# Patient Record
Sex: Female | Born: 1995 | Race: White | Hispanic: No | Marital: Single | Smoking: Never smoker
Health system: Southern US, Community
[De-identification: ages and names within clinical notes are randomized; demographics above are authoritative.]

---
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
## 2019-01-07 ENCOUNTER — Other Ambulatory Visit: Payer: Self-pay

## 2019-01-07 DIAGNOSIS — Z20822 Contact with and (suspected) exposure to covid-19: Secondary | ICD-10-CM

## 2019-01-08 LAB — NOVEL CORONAVIRUS, NAA: SARS-CoV-2, NAA: NOT DETECTED

## 2019-02-04 ENCOUNTER — Other Ambulatory Visit: Payer: Self-pay

## 2019-02-04 DIAGNOSIS — Z20822 Contact with and (suspected) exposure to covid-19: Secondary | ICD-10-CM

## 2019-02-06 LAB — NOVEL CORONAVIRUS, NAA: SARS-CoV-2, NAA: NOT DETECTED

## 2020-09-11 ENCOUNTER — Ambulatory Visit (LOCAL_COMMUNITY_HEALTH_CENTER): Payer: Self-pay | Admitting: Nurse Practitioner

## 2020-09-11 ENCOUNTER — Other Ambulatory Visit: Payer: Self-pay

## 2020-09-11 DIAGNOSIS — Z23 Encounter for immunization: Secondary | ICD-10-CM

## 2020-09-11 NOTE — Progress Notes (Signed)
25 year old female seen today for immunizations.   Glenna Fellows, RN

## 2020-10-02 ENCOUNTER — Ambulatory Visit (LOCAL_COMMUNITY_HEALTH_CENTER): Payer: Self-pay

## 2020-10-02 ENCOUNTER — Other Ambulatory Visit: Payer: Self-pay

## 2020-10-02 DIAGNOSIS — Z0184 Encounter for antibody response examination: Secondary | ICD-10-CM

## 2020-10-02 DIAGNOSIS — Z111 Encounter for screening for respiratory tuberculosis: Secondary | ICD-10-CM

## 2020-10-02 NOTE — Progress Notes (Signed)
Presents for Hepatitis B titer as required for medical stenography clinicals per client. [facility] reflects prior documentation of complete Hepatitis B series. Client elected to have quantitative titer today and to check with finance prior to leaving appt to determine if charged correctly. ROI for titer results signed. Jossie Ng, RN

## 2020-10-03 LAB — HEPATITIS B SURFACE ANTIBODY, QUANTITATIVE: Hepatitis B Surf Ab Quant: 29.6 m[IU]/mL (ref >9.9)

## 2020-10-05 ENCOUNTER — Ambulatory Visit (LOCAL_COMMUNITY_HEALTH_CENTER): Payer: Medicaid Other

## 2020-10-05 DIAGNOSIS — Z0184 Encounter for antibody response examination: Secondary | ICD-10-CM

## 2020-10-05 DIAGNOSIS — Z111 Encounter for screening for respiratory tuberculosis: Secondary | ICD-10-CM

## 2020-10-05 LAB — TB SKIN TEST
Induration: 0 mm
TB Skin Test: NEGATIVE

## 2020-10-05 NOTE — Progress Notes (Signed)
PPDR 0 mm, Negative.  Copy of Hep B titer results showing consistent with immunity given to pt and explained. No additional tx/vaccine indicated. Pt plans to schedule ppd 2nd step in one week. Jerel Shepherd, RN

## 2020-10-13 ENCOUNTER — Ambulatory Visit (LOCAL_COMMUNITY_HEALTH_CENTER): Payer: Self-pay

## 2020-10-13 ENCOUNTER — Other Ambulatory Visit: Payer: Self-pay

## 2020-10-13 DIAGNOSIS — Z111 Encounter for screening for respiratory tuberculosis: Secondary | ICD-10-CM

## 2020-10-16 ENCOUNTER — Other Ambulatory Visit: Payer: Self-pay

## 2020-10-16 ENCOUNTER — Ambulatory Visit (LOCAL_COMMUNITY_HEALTH_CENTER): Payer: Medicaid Other

## 2020-10-16 DIAGNOSIS — Z111 Encounter for screening for respiratory tuberculosis: Secondary | ICD-10-CM

## 2020-10-16 LAB — TB SKIN TEST
Induration: 0 mm
TB Skin Test: NEGATIVE

## 2021-02-19 ENCOUNTER — Ambulatory Visit (LOCAL_COMMUNITY_HEALTH_CENTER): Payer: Self-pay

## 2021-02-19 ENCOUNTER — Other Ambulatory Visit: Payer: Self-pay

## 2021-02-19 DIAGNOSIS — Z23 Encounter for immunization: Secondary | ICD-10-CM

## 2021-02-19 NOTE — Progress Notes (Signed)
In Nurse Clinic for flu vaccine. Meets criteria for state supplied vaccine d/t family planning waiver. Tolerated flu vaccine well. Updated [facility] copy given. Jerel Shepherd, RN

## 2021-09-28 ENCOUNTER — Other Ambulatory Visit: Payer: Self-pay

## 2021-09-28 ENCOUNTER — Emergency Department: Payer: Self-pay

## 2021-09-28 ENCOUNTER — Emergency Department
Admission: EM | Admit: 2021-09-28 | Discharge: 2021-09-28 | Disposition: A | Discharge status: Home / Self Care | Payer: Self-pay | Attending: Emergency Medicine | Admitting: Emergency Medicine

## 2021-09-28 DIAGNOSIS — Y93K1 Activity, walking an animal: Secondary | ICD-10-CM | POA: Insufficient documentation

## 2021-09-28 DIAGNOSIS — M79642 Pain in left hand: Secondary | ICD-10-CM | POA: Insufficient documentation

## 2021-09-28 DIAGNOSIS — W548XXA Other contact with dog, initial encounter: Secondary | ICD-10-CM | POA: Insufficient documentation

## 2021-09-28 DIAGNOSIS — S62637A Displaced fracture of distal phalanx of left little finger, initial encounter for closed fracture: Secondary | ICD-10-CM | POA: Insufficient documentation

## 2021-09-28 DIAGNOSIS — S62639A Displaced fracture of distal phalanx of unspecified finger, initial encounter for closed fracture: Secondary | ICD-10-CM

## 2021-09-28 NOTE — ED Triage Notes (Signed)
Pt comes wit hc/o left pinkie finger injury. Pt states she did this yesterday and may have gotten it tangled in a dog leash. ?

## 2021-09-28 NOTE — ED Provider Notes (Signed)
?[facility] ?Provider Note ? ? ?CSN: [id number] ?Arrival date & time: 09/28/21  1650 ? ?  ? ?History ? ?Chief Complaint  ?Patient presents with  ? Finger Injury  ? ? ?Allison Noble is a 26 y.o. female presents to the emergency department for evaluation of left fifth digit injury.  Yesterday he was walking the dog, leash twisted her left fifth digit, she has had pain swelling and ecchymosis since.  No numbness or tingling.  No bleeding. ? ?HPI ? ?  ? ?Home Medications ?Prior to Admission medications   ?Not on File  ?   ? ?Allergies    ?Patient has no known allergies.   ? ?Review of Systems   ?Review of Systems ? ?Physical Exam ?Updated Vital Signs ?BP (!) 139/101   Pulse 64   Temp 98.3 ?F (36.8 ?C) (Oral)   Resp 18   SpO2 95%  ?Physical Exam ?Constitutional:   ?   Appearance: She is well-developed.  ?HENT:  ?   Head: Normocephalic and atraumatic.  ?Eyes:  ?   Conjunctiva/sclera: Conjunctivae normal.  ?Cardiovascular:  ?   Rate and Rhythm: Normal rate.  ?Pulmonary:  ?   Effort: Pulmonary effort is normal. No respiratory distress.  ?Musculoskeletal:     ?   General: Normal range of motion.  ?   Cervical back: Normal range of motion.  ?   Comments: Left fifth digit with ecchymosis along the DIP joint with normal active extension, limited flexion.  No skin breakdown noted.  Sensation is intact distally.  Nail is intact, no subungual hematoma.  ?Skin: ?   General: Skin is warm.  ?   Findings: No rash.  ?Neurological:  ?   Mental Status: She is alert and oriented to person, place, and time.  ?Psychiatric:     ?   Behavior: Behavior normal.     ?   Thought Content: Thought content normal.  ? ? ?ED Results / Procedures / Treatments   ?Labs ?(all labs ordered are listed, but only abnormal results are displayed) ?Labs Reviewed - No data to display ? ?EKG ?None ? ?Radiology ?DG Finger Little Left ? ?Result Date: 09/28/2021 ?CLINICAL DATA:  Left fifth digit injury EXAM: LEFT LITTLE  FINGER 2+V COMPARISON:  None Available. FINDINGS: Frontal, oblique, and lateral views of the left fifth digit are obtained. There is an avulsion fracture volar aspect base of the fifth distal phalanx, with approximately 8 mm of retraction of the fracture fragment proximally. No other acute bony abnormalities. Joint spaces are well preserved. Diffuse soft tissue swelling. IMPRESSION: 1. Avulsion fracture volar aspect base of the fifth distal phalanx. Electronically Signed   By: Randa Ngo M.D.   On: 09/28/2021 17:21   ? ?Procedures ?Procedures  ?Patient placed into aluminum foam splint with buddy tape. ?Splint placed along the dorsum of the digit to avoid compression along the avulsion fragment ?Medications Ordered in ED ?Medications - No data to display ? ?ED Course/ Medical Decision Making/ A&P ?  ?                        ?Medical Decision Making ?Amount and/or Complexity of Data Reviewed ?Radiology: ordered. ? ? ?26 year old with left fifth digit injury, x-rays show avulsion along the intra-articular portion of the volar aspect of distal phalanx with displacement.  Patient will follow-up with orthopedic hand specialist, she will call office tomorrow to schedule follow-up appointment.  In the meantime dorsal  splint is applied for comfort.  She will take Tylenol and ibuprofen as needed for pain. ?Final Clinical Impression(s) / ED Diagnoses ?Final diagnoses:  ?Closed displaced fracture of distal phalanx of finger of left hand  ? ? ?Rx / DC Orders ?ED Discharge Orders   ? ? None  ? ?  ? ? ?  ?Duanne Guess, PA-C ?09/28/21 1825 ? ?  ?Vladimir Crofts, MD ?09/28/21 2346 ? ?

## 2021-09-28 NOTE — Discharge Instructions (Signed)
Please wear splint as needed for comfort.  You may take Tylenol and ibuprofen as needed for pain.  Call orthopedic office tomorrow to schedule follow-up appointment ?

## 2021-09-28 NOTE — ED Provider Triage Note (Signed)
Emergency Medicine Provider Triage Evaluation Note ? ?Allison Noble , a 26 y.o. female  was evaluated in triage.  Pt complains of left little finger DIP joint pain, swelling, ecchymosis.  Yesterday twisted the finger while walking the dog with the leash. ? ?Review of Systems  ?Positive: Joint pain ?Negative: Numbness, redness, wound ? ?Physical Exam  ?BP (!) 139/101   Pulse 64   Temp 98.3 ?F (36.8 ?C) (Oral)   Resp 18   SpO2 95%  ?Gen:   Awake, no distress   ?Resp:  Normal effort  ?MSK:   Moves extremities without difficulty  ?Other:  Left little digit able to perform full active extension, limited flexion.  Swelling and ecchymosis at the DIP joint, nail is intact with no skin breakdown noted and no nail injury ? ?Medical Decision Making  ?Medically screening exam initiated at 4:55 PM.  Appropriate orders placed.  Allison Noble was informed that the remainder of the evaluation will be completed by another provider, this initial triage assessment does not replace that evaluation, and the importance of remaining in the ED until their evaluation is complete. ? ? ?  ?Evon Slack, PA-C ?09/28/21 1656 ? ?

## 2021-12-13 ENCOUNTER — Ambulatory Visit (LOCAL_COMMUNITY_HEALTH_CENTER): Payer: Self-pay

## 2021-12-13 DIAGNOSIS — Z111 Encounter for screening for respiratory tuberculosis: Secondary | ICD-10-CM

## 2021-12-13 NOTE — Progress Notes (Signed)
In nurse clinic today for PPD. Per pt states needs 2nd step PPD -advised to make appt for 8/11. Pt to stop at registration to make appt.

## 2021-12-16 ENCOUNTER — Ambulatory Visit (LOCAL_COMMUNITY_HEALTH_CENTER): Payer: Self-pay

## 2021-12-16 DIAGNOSIS — Z111 Encounter for screening for respiratory tuberculosis: Secondary | ICD-10-CM

## 2021-12-16 LAB — TB SKIN TEST
Induration: 0 mm
TB Skin Test: NEGATIVE

## 2021-12-24 ENCOUNTER — Ambulatory Visit (LOCAL_COMMUNITY_HEALTH_CENTER): Payer: Self-pay

## 2021-12-24 DIAGNOSIS — Z111 Encounter for screening for respiratory tuberculosis: Secondary | ICD-10-CM

## 2021-12-27 ENCOUNTER — Ambulatory Visit (LOCAL_COMMUNITY_HEALTH_CENTER): Payer: Self-pay

## 2021-12-27 DIAGNOSIS — Z111 Encounter for screening for respiratory tuberculosis: Secondary | ICD-10-CM

## 2021-12-27 LAB — TB SKIN TEST
Induration: 0 mm
TB Skin Test: NEGATIVE

## 2023-08-12 IMAGING — CR DG FINGER LITTLE 2+V*L*
3 series · 3 of 3 positions shown · non-contrast
Comparison: None Available.

CLINICAL DATA: Left fifth digit injury

EXAM:
LEFT LITTLE FINGER 2+V

[finger ap]
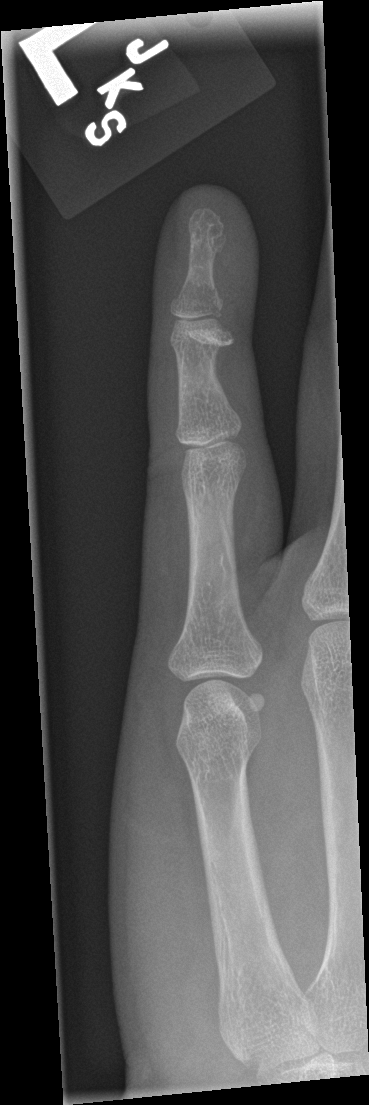

[finger obl]
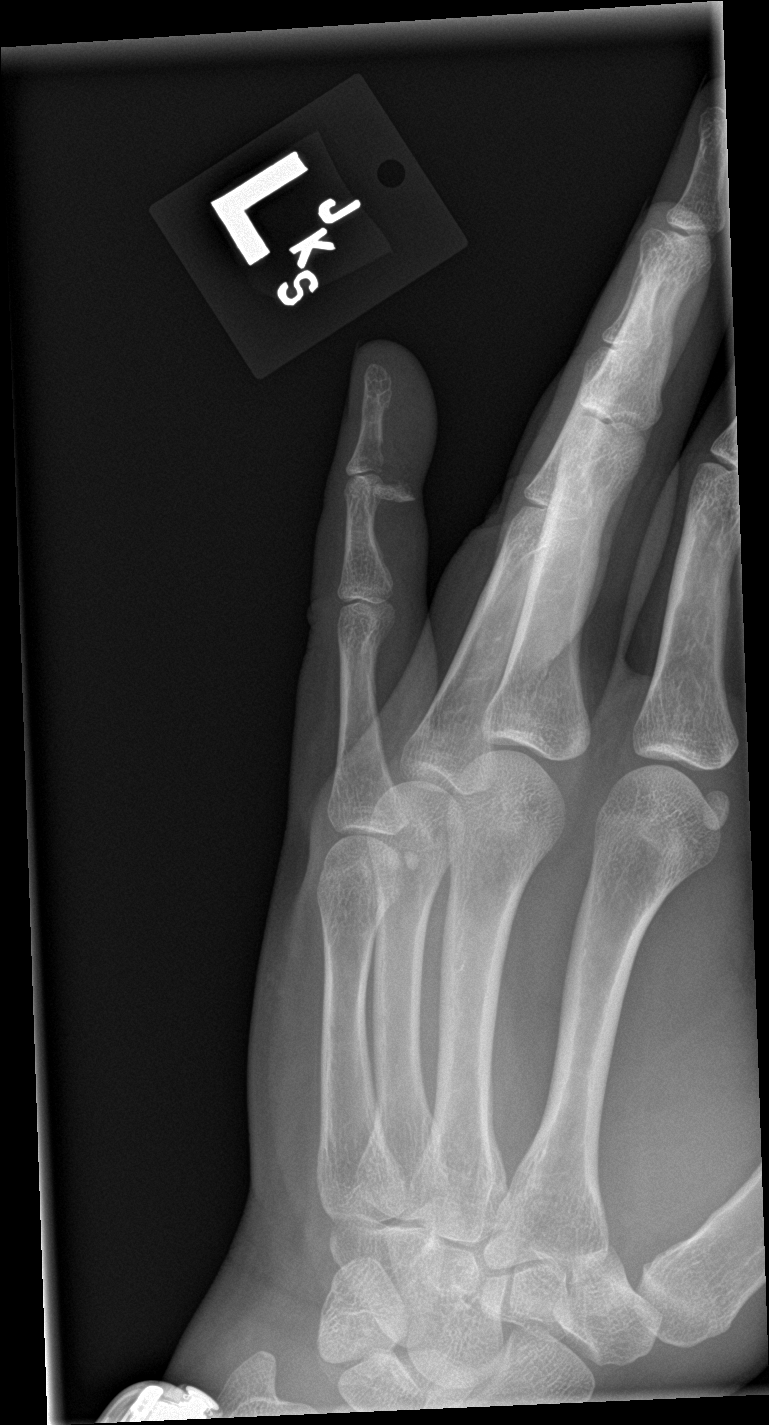

[finger lat]
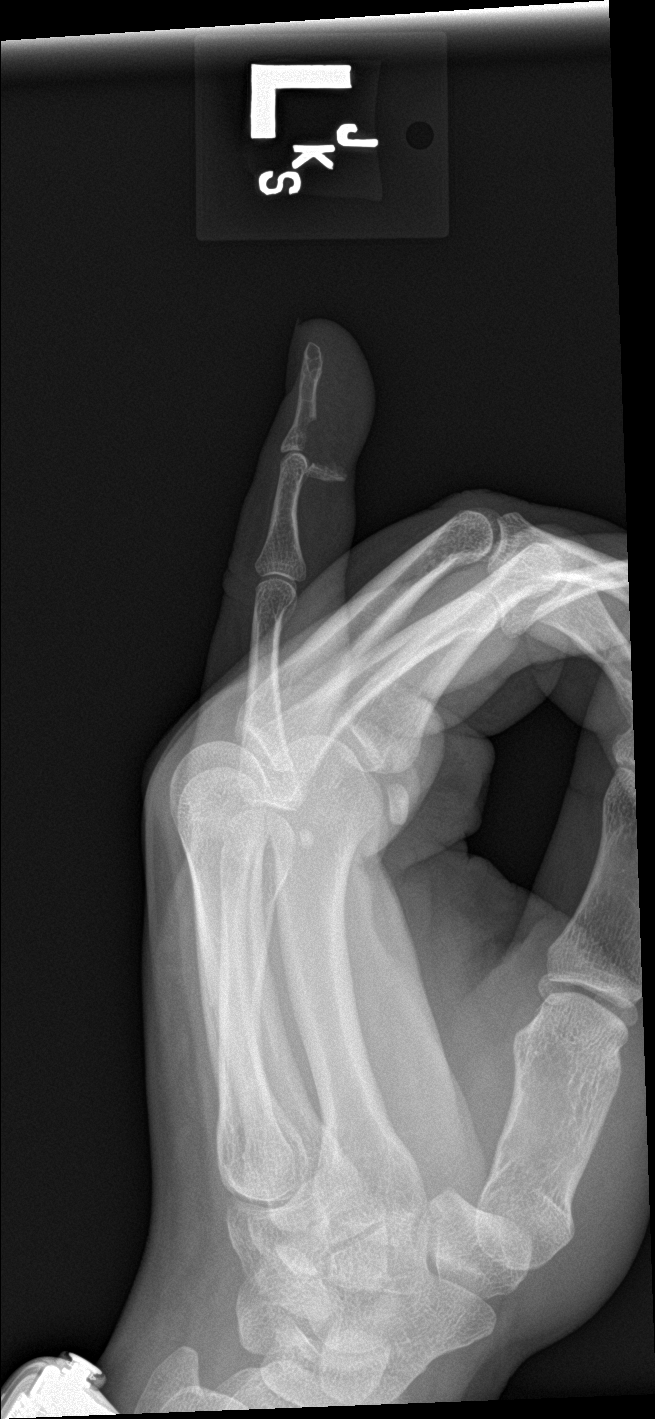

[3 of 3 positions shown; findings below may reference images not displayed]

FINDINGS: Frontal, oblique, and lateral views of the left fifth digit are
obtained. There is an avulsion fracture volar aspect base of the
fifth distal phalanx, with approximately 8 mm of retraction of the
fracture fragment proximally. No other acute bony abnormalities.
Joint spaces are well preserved. Diffuse soft tissue swelling.
IMPRESSION: 1. Avulsion fracture volar aspect base of the fifth distal phalanx.
